# Patient Record
Sex: Female | Born: 1955 | Race: Black or African American | Hispanic: No | Marital: Single | Smoking: Current every day smoker
Health system: Southern US, Community
[De-identification: ages and names within clinical notes are randomized; demographics above are authoritative.]

## PROBLEM LIST (undated history)

## (undated) DIAGNOSIS — I10 Essential (primary) hypertension: Secondary | ICD-10-CM

## (undated) DIAGNOSIS — E119 Type 2 diabetes mellitus without complications: Secondary | ICD-10-CM

## (undated) HISTORY — PX: TONSILLECTOMY: SUR1361

## (undated) HISTORY — PX: KNEE SURGERY: SHX244

---

## 2015-05-11 ENCOUNTER — Emergency Department (HOSPITAL_COMMUNITY): Payer: Medicaid - Out of State

## 2015-05-11 ENCOUNTER — Encounter (HOSPITAL_COMMUNITY): Payer: Self-pay | Admitting: *Deleted

## 2015-05-11 ENCOUNTER — Emergency Department (HOSPITAL_COMMUNITY)
Admission: EM | Admit: 2015-05-11 | Discharge: 2015-05-11 | Disposition: A | Discharge status: Home / Self Care | Payer: Medicaid - Out of State | Attending: Emergency Medicine | Admitting: Emergency Medicine

## 2015-05-11 DIAGNOSIS — R109 Unspecified abdominal pain: Secondary | ICD-10-CM

## 2015-05-11 DIAGNOSIS — I10 Essential (primary) hypertension: Secondary | ICD-10-CM | POA: Insufficient documentation

## 2015-05-11 DIAGNOSIS — Z720 Tobacco use: Secondary | ICD-10-CM | POA: Diagnosis not present

## 2015-05-11 DIAGNOSIS — E119 Type 2 diabetes mellitus without complications: Secondary | ICD-10-CM | POA: Diagnosis not present

## 2015-05-11 DIAGNOSIS — Z792 Long term (current) use of antibiotics: Secondary | ICD-10-CM | POA: Diagnosis not present

## 2015-05-11 DIAGNOSIS — K5900 Constipation, unspecified: Secondary | ICD-10-CM | POA: Insufficient documentation

## 2015-05-11 HISTORY — DX: Essential (primary) hypertension: I10

## 2015-05-11 HISTORY — DX: Type 2 diabetes mellitus without complications: E11.9

## 2015-05-11 MED ORDER — FLEET ENEMA 7-19 GM/118ML RE ENEM
1.0000 | ENEMA | Freq: Once | RECTAL | Status: AC
  Administered 2015-05-11: 1 via RECTAL

## 2015-05-11 MED ORDER — BISACODYL 10 MG RE SUPP
10.0000 mg | Freq: Once | RECTAL | Status: AC
  Administered 2015-05-11: 10 mg via RECTAL
  Filled 2015-05-11: qty 1

## 2015-05-11 NOTE — ED Provider Notes (Signed)
CSN: [id number]     Arrival date & time 05/11/15  0123 History   First MD Initiated Contact with Patient 05/11/15 0147     Chief Complaint  Patient presents with  . Abdominal Pain     (Consider location/radiation/quality/duration/timing/severity/associated sxs/prior Treatment) HPI patient reports she has had abdominal pain for the past 4 weeks. She has been seen at the [facility] ED a couple of times. She states "they didn't do anything for me". She states they did blood work, she's had x-rays, she had a CT scan which she states showed a infection in her kidney, pelvis, and liver. She's currently on Cipro for that. They also found a lesion on her liver. She has been seen by a specialist, possibly Dr.Haigh, who is a gastroenterologist, and she is scheduled to have a biopsy of her liver done at 10 AM this morning in the radiology department. She states she has not had a bowel movement in 1 week. She was started on oxycodone for pain which she states she takes twice a day. He states however before that her bowel movements were hard. She denies any fever but does describe hot flashes. She does admit her abdomen swelling for an unknown length of time. She denies swelling in her extremities. She went to the ED at [facility] and thought she waited too long to be seen and came here.  PCP Dr Byrd Hesselbach in [place], [place]  Past Medical History  Diagnosis Date  . Hypertension   . Diabetes mellitus without complication    Past Surgical History  Procedure Laterality Date  . Tonsillectomy    . Knee surgery     No family history on file. History  Substance Use Topics  . Smoking status: Current Every Day Smoker  . Smokeless tobacco: Not on file  . Alcohol Use: Yes   On disability after [employer]  OB History    No data available     Review of Systems  All other systems reviewed and are negative.     Allergies  Review of patient's allergies indicates no known allergies.  Home Medications    Prior to Admission medications   Medication Sig Start Date End Date Taking? Authorizing Provider  ciprofloxacin (CIPRO) 500 MG tablet Take 500 mg by mouth 2 (two) times daily.   Yes Historical Provider, MD  metFORMIN (GLUCOPHAGE) 500 MG tablet Take 500 mg by mouth 1 day or 1 dose.   Yes Historical Provider, MD  ondansetron (ZOFRAN-ODT) 4 MG disintegrating tablet Take 4 mg by mouth every 8 (eight) hours as needed for nausea or vomiting.   Yes Historical Provider, MD  oxyCODONE-acetaminophen (PERCOCET/ROXICET) 5-325 MG per tablet Take 1 tablet by mouth every 4 (four) hours as needed for severe pain.   Yes Historical Provider, MD   BP 147/110 mmHg  Pulse 108  Temp(Src) 97.8 F (36.6 C) (Oral)  Resp 20  Ht  (1.702 m)  Wt 152 lb (68.947 kg)  BMI 23.80 kg/m2  SpO2 92%  Vital signs normal except for tachycardia  Physical Exam  Constitutional: She is oriented to person, place, and time. She appears well-developed and well-nourished.  Non-toxic appearance. She does not appear ill. No distress.  HENT:  Head: Normocephalic and atraumatic.  Right Ear: External ear normal.  Left Ear: External ear normal.  Nose: Nose normal. No mucosal edema or rhinorrhea.  Mouth/Throat: Oropharynx is clear and moist and mucous membranes are normal. No dental abscesses or uvula swelling.  Eyes: Conjunctivae and  EOM are normal. Pupils are equal, round, and reactive to light.  Neck: Normal range of motion and full passive range of motion without pain. Neck supple.  Cardiovascular: Normal rate, regular rhythm and normal heart sounds.  Exam reveals no gallop and no friction rub.   No murmur heard. Pulmonary/Chest: Effort normal and breath sounds normal. No respiratory distress. She has no wheezes. She has no rhonchi. She has no rales. She exhibits no tenderness and no crepitus.  Abdominal: Soft. Normal appearance and bowel sounds are normal. She exhibits no distension. There is no tenderness. There is no  rebound and no guarding.  Abdomen appears distended however it is soft. She has active bowel sounds. She does not appear to be tender in any specific area.  Musculoskeletal: Normal range of motion. She exhibits no edema or tenderness.  Moves all extremities well.   Neurological: She is alert and oriented to person, place, and time. She has normal strength. No cranial nerve deficit.  Skin: Skin is warm, dry and intact. No rash noted. No erythema. No pallor.  Psychiatric: She has a normal mood and affect. Her speech is normal and behavior is normal. Her mood appears not anxious.  Nursing note and vitals reviewed.   ED Course  Procedures (including critical care time)  Medications  bisacodyl (DULCOLAX) suppository 10 mg (10 mg Rectal Given 05/11/15 0337)  sodium phosphate (FLEET) 7-19 GM/118ML enema 1 enema (1 enema Rectal Given 05/11/15 0411)     We contacted [facility] however we are unable to get any records until 8 AM this morning  Patient did not have any results with the do collect suppository. She was given a fleets enema and had 2 bowel movements and states her pain is improving. Patient is being discharged. I have talked to her daughter who has neurologic on how to give it to her today after she has her liver biopsy done.  Labs Review Labs Reviewed - No data to display  Imaging Review Dg Abd 2 Views  05/11/2015   CLINICAL DATA:  Subacute onset of generalized abdominal pain, nausea and vomiting. Initial encounter.  EXAM: ABDOMEN - 2 VIEW  COMPARISON:  None.  FINDINGS: There is dilatation of small bowel loops up to 3.7 cm in maximal diameter. Underlying stool is still seen within the colon. This may reflect some degree of small bowel dysmotility. Nondistended loops of small bowel suggest against partial small bowel obstruction. There may be underlying ileitis.  No free intra-abdominal air is identified on the provided upright view.  The visualized osseous structures are within  normal limits; the sacroiliac joints are unremarkable in appearance. Minimal degenerative change is noted at the lower lumbar spine.  IMPRESSION: Dilatation of small-bowel loops to 3.7 cm in maximal diameter. Underlying stool still seen in the colon. This may reflect some degree of small bowel dysmotility. Nondistended loops of small bowel suggests against partial small bowel obstruction. Ileitis could have such an appearance.   Electronically Signed   By: Roanna Raider M.D.   On: 05/11/2015 03:23     EKG Interpretation None      MDM   Final diagnoses:  Abdominal pain, acute  Constipation, unspecified constipation type   Plan discharge  Devoria Albe, MD, Concha Pyo, MD 05/11/15 [phone]

## 2015-05-11 NOTE — ED Notes (Signed)
Dr.Knapp at bedside  

## 2015-05-11 NOTE — Discharge Instructions (Signed)
Get miralax and put one dose or 17 g in 8 ounces of water,  take 1 dose every 30 minutes for 2-3 hours or until you  get good results and then once or twice daily to prevent constipation. The pain medication, oxycodone, will make you develop constipation. Keep your appointment this morning to get your biopsy done. (do the miralax later today).  Follow up with your doctors to get your test results and to discuss your pain control.    Constipation Constipation is when a person has fewer than three bowel movements a week, has difficulty having a bowel movement, or has stools that are dry, hard, or larger than normal. As people grow older, constipation is more common. If you try to fix constipation with medicines that make you have a bowel movement (laxatives), the problem may get worse. Long-term laxative use may cause the muscles of the colon to become weak. A low-fiber diet, not taking in enough fluids, and taking certain medicines may make constipation worse.  CAUSES   Certain medicines, such as antidepressants, pain medicine, iron supplements, antacids, and water pills.   Certain diseases, such as diabetes, irritable bowel syndrome (IBS), thyroid disease, or depression.   Not drinking enough water.   Not eating enough fiber-rich foods.   Stress or travel.   Lack of physical activity or exercise.   Ignoring the urge to have a bowel movement.   Using laxatives too much.  SIGNS AND SYMPTOMS   Having fewer than three bowel movements a week.   Straining to have a bowel movement.   Having stools that are hard, dry, or larger than normal.   Feeling full or bloated.   Pain in the lower abdomen.   Not feeling relief after having a bowel movement.  DIAGNOSIS  Your health care provider will take a medical history and perform a physical exam. Further testing may be done for severe constipation. Some tests may include:  A barium enema X-ray to examine your rectum, colon, and,  sometimes, your small intestine.   A sigmoidoscopy to examine your lower colon.   A colonoscopy to examine your entire colon. TREATMENT  Treatment will depend on the severity of your constipation and what is causing it. Some dietary treatments include drinking more fluids and eating more fiber-rich foods. Lifestyle treatments may include regular exercise. If these diet and lifestyle recommendations do not help, your health care provider may recommend taking over-the-counter laxative medicines to help you have bowel movements. Prescription medicines may be prescribed if over-the-counter medicines do not work.  HOME CARE INSTRUCTIONS   Eat foods that have a lot of fiber, such as fruits, vegetables, whole grains, and beans.  Limit foods high in fat and processed sugars, such as french fries, hamburgers, cookies, candies, and soda.   A fiber supplement may be added to your diet if you cannot get enough fiber from foods.   Drink enough fluids to keep your urine clear or pale yellow.   Exercise regularly or as directed by your health care provider.   Go to the restroom when you have the urge to go. Do not hold it.   Only take over-the-counter or prescription medicines as directed by your health care provider. Do not take other medicines for constipation without talking to your health care provider first.  SEEK IMMEDIATE MEDICAL CARE IF:   You have bright red blood in your stool.   Your constipation lasts for more than 4 days or gets worse.   You  have abdominal or rectal pain.   You have thin, pencil-like stools.   You have unexplained weight loss. MAKE SURE YOU:   Understand these instructions.  Will watch your condition.  Will get help right away if you are not doing well or get worse. Document Released: 07/04/2004 Document Revised: 10/11/2013 Document Reviewed: 07/18/2013 [facility] Patient Information 2015 [place], [place]. This information is not intended to replace  advice given to you by your health care provider. Make sure you discuss any questions you have with your health care provider.

## 2015-05-11 NOTE — ED Notes (Signed)
Pt was able to use bathroom on two separate occasions with improvement in abd pain, pt reports that she had bowel movements each time,

## 2015-05-11 NOTE — ED Notes (Signed)
Pt states that her last bowel movement was last Saturday, has been taking laxative with no improvement

## 2015-05-11 NOTE — ED Notes (Signed)
Pt c/o abd pain, nausea, vomiting for the past three weeks, has been seen by pcp for same, has been seen at [facility] hospital for same pain, was there tonight in er due to pain but had to wait so pt came to er here, is scheduled for biopsy for tomorrow at [facility].

## 2015-06-21 DEATH — deceased

## 2016-03-06 IMAGING — DX DG ABDOMEN 2V
2 series · 2 of 2 positions shown · non-contrast
Comparison: None.

CLINICAL DATA: Subacute onset of generalized abdominal pain, nausea
and vomiting. Initial encounter.

EXAM:
ABDOMEN - 2 VIEW

[abdomen erect]
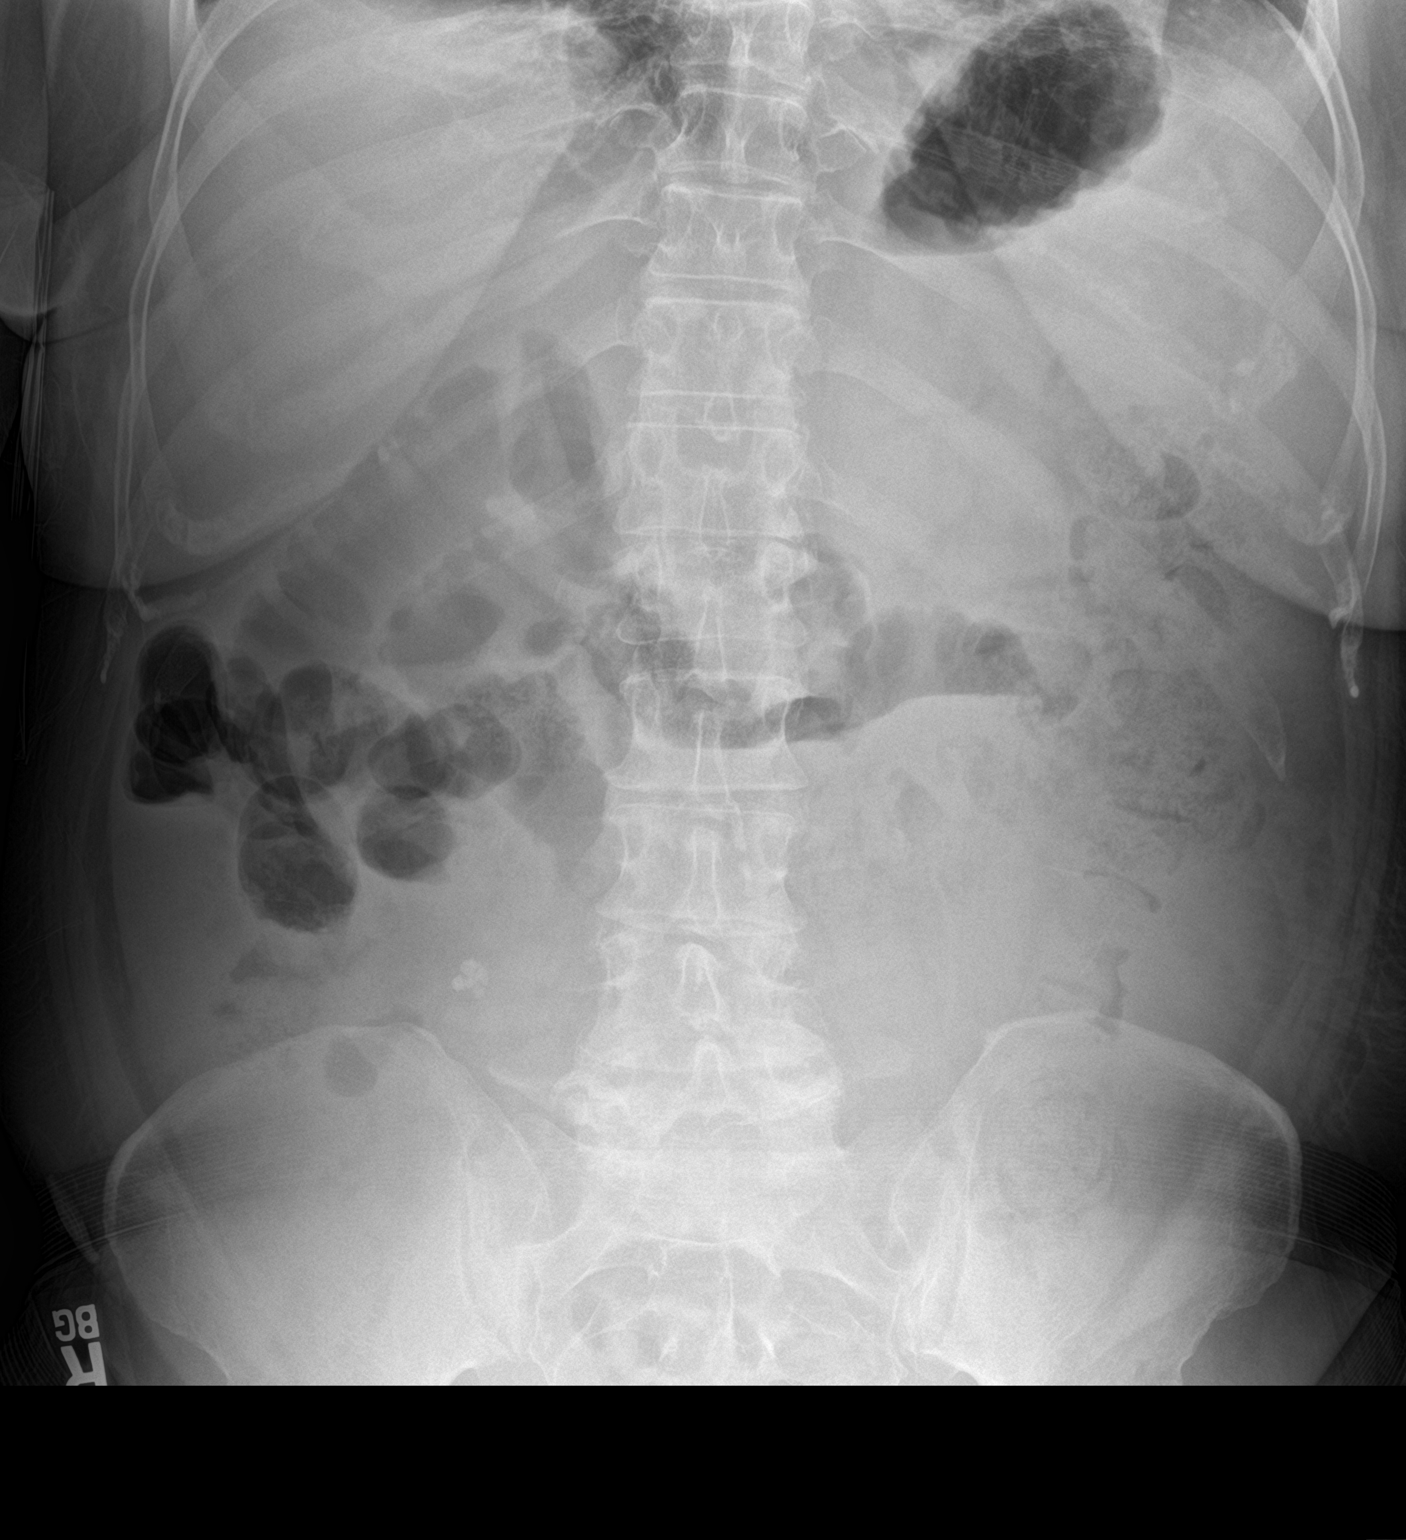

[abdomen supine]
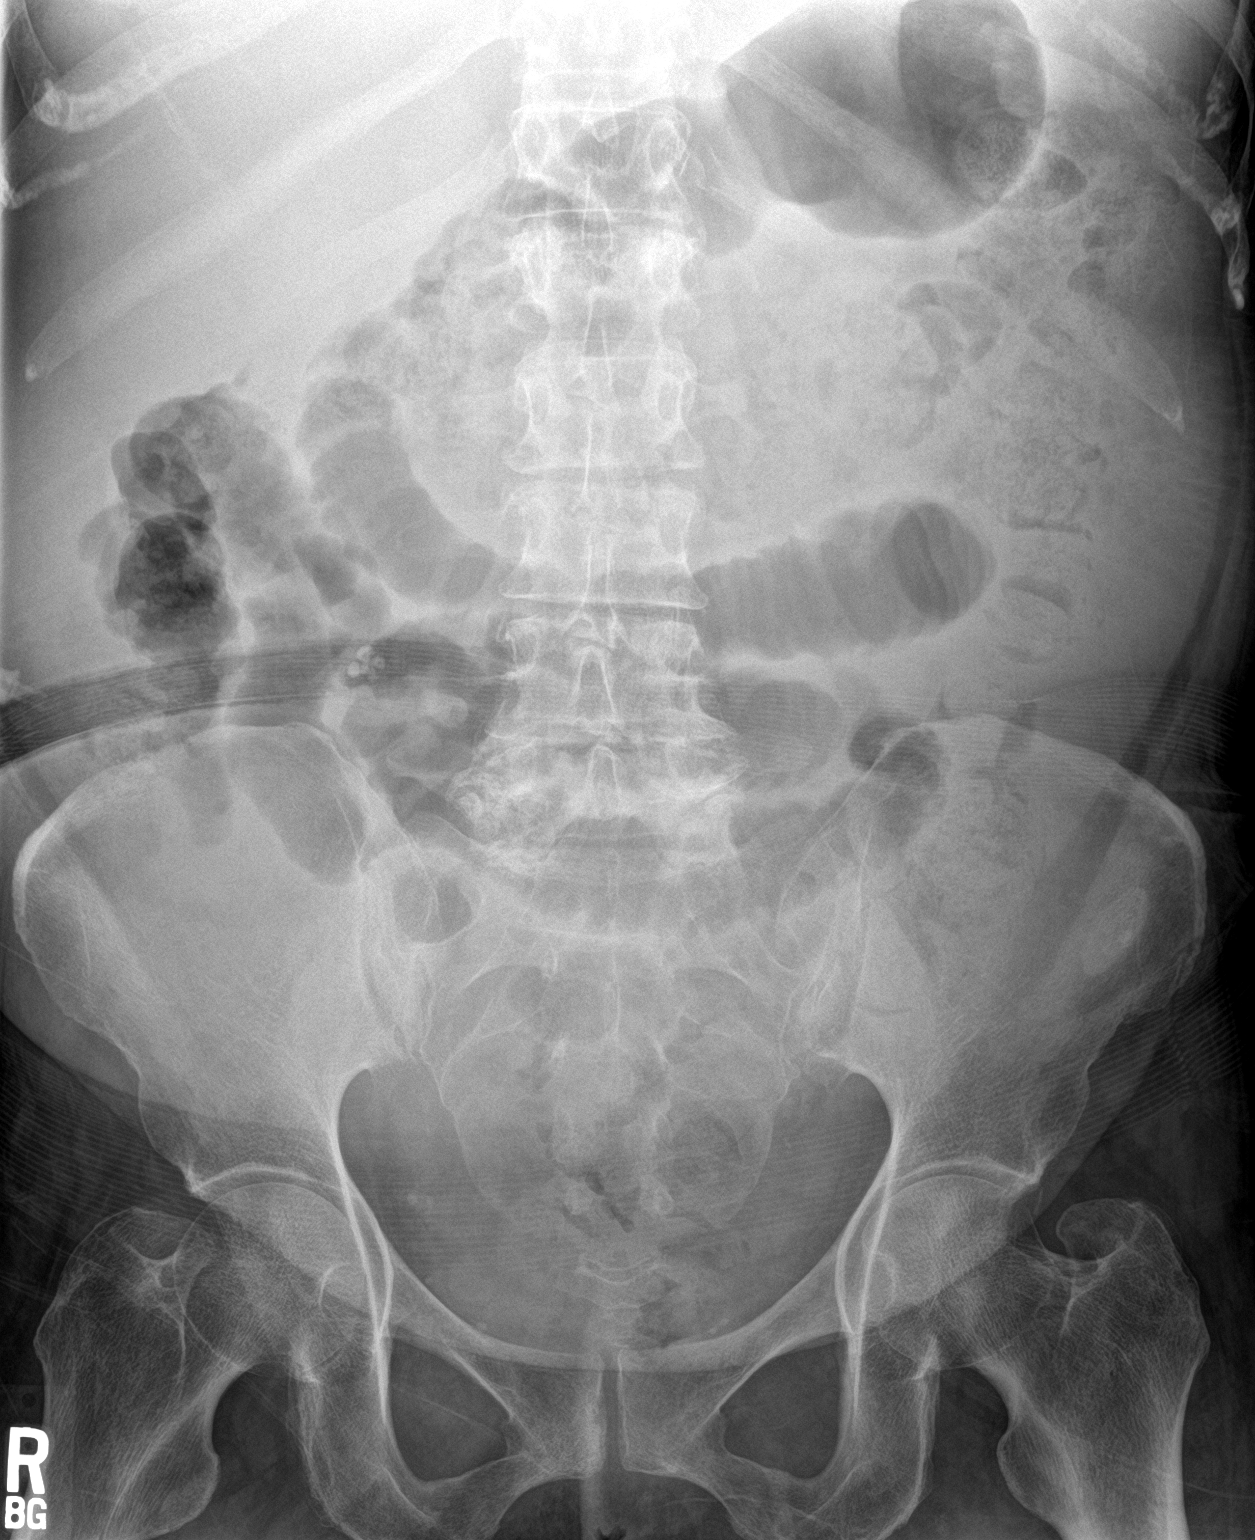

[2 of 2 positions shown; findings below may reference images not displayed]

FINDINGS: There is dilatation of small bowel loops up to 3.7 cm in maximal
diameter. Underlying stool is still seen within the colon. This may
reflect some degree of small bowel dysmotility. Nondistended loops
of small bowel suggest against partial small bowel obstruction.
There may be underlying ileitis.

No free intra-abdominal air is identified on the provided upright
view.

The visualized osseous structures are within normal limits; the
sacroiliac joints are unremarkable in appearance. Minimal
degenerative change is noted at the lower lumbar spine.
IMPRESSION: Dilatation of small-bowel loops to 3.7 cm in maximal diameter.
Underlying stool still seen in the colon. This may reflect some
degree of small bowel dysmotility. Nondistended loops of small bowel
suggests against partial small bowel obstruction. Ileitis could have
such an appearance.
